# Patient Record
Sex: Male | Born: 1990 | Race: Asian | Hispanic: No | Marital: Single | Smoking: Former smoker
Health system: Southern US, Community
[De-identification: ages and names within clinical notes are randomized; demographics above are authoritative.]

---

## 2013-08-10 ENCOUNTER — Encounter (HOSPITAL_COMMUNITY): Payer: Self-pay | Admitting: Emergency Medicine

## 2013-08-10 ENCOUNTER — Emergency Department (INDEPENDENT_AMBULATORY_CARE_PROVIDER_SITE_OTHER)
Admission: EM | Admit: 2013-08-10 | Discharge: 2013-08-10 | Disposition: A | Discharge status: Home / Self Care | Payer: 59 | Source: Home / Self Care | Attending: Emergency Medicine | Admitting: Emergency Medicine

## 2013-08-10 ENCOUNTER — Emergency Department (INDEPENDENT_AMBULATORY_CARE_PROVIDER_SITE_OTHER): Payer: 59

## 2013-08-10 DIAGNOSIS — S93409A Sprain of unspecified ligament of unspecified ankle, initial encounter: Secondary | ICD-10-CM

## 2013-08-10 MED ORDER — HYDROCODONE-ACETAMINOPHEN 5-325 MG PO TABS: ORAL_TABLET | ORAL | Status: DC

## 2013-08-10 MED ORDER — IBUPROFEN 800 MG PO TABS
ORAL_TABLET | ORAL | Status: AC
  Filled 2013-08-10: qty 1

## 2013-08-10 MED ORDER — IBUPROFEN 800 MG PO TABS
800.0000 mg | ORAL_TABLET | Freq: Once | ORAL | Status: AC
  Administered 2013-08-10: 800 mg via ORAL

## 2013-08-10 NOTE — Discharge Instructions (Signed)

## 2013-08-10 NOTE — ED Provider Notes (Signed)
  Chief Complaint   Chief Complaint  Patient presents with  . Fall    History of Present Illness   Bruce Daniels is a 23 year old male who this morning slipped on some ice and twisted his right ankle. He's had pain over the lateral malleolus ever since then. It is not swollen. He did not hear a pop. He is able to bear some weight. The ankle and foot feel numb and tingly.  Review of Systems   Other than as noted above, the patient denies any of the following symptoms: Systemic:  No fevers, chills, sweats, or aches.   Musculoskeletal:  No joint pain, arthritis, bursitis, swelling, back pain, or neck pain. Neurological:  No muscular weakness or paresthesias.  PMFSH   Past medical history, family history, social history, meds, and allergies were reviewed.   Physical Examination     Vital signs:  BP 106/59  Pulse 126  Temp(Src) 98.7 F (37.1 C) (Oral)  Resp 18  SpO2 100% Gen:  Alert and oriented times 3.  In no distress. Musculoskeletal: Exam of the ankle reveals no swelling, bruising or, or deformity. There is pain to palpation just below the lateral malleolus. Anterior drawer sign was negative.  Talar tilt was negative. Squeeze test was positive. Achilles tendon, peroneal tendon, and tibialis posterior were intact. Otherwise, all joints had a full a ROM with no swelling, bruising or deformity.  No edema, pulses full. Extremities were warm and pink.  Capillary refill was brisk.  Skin:  Clear, warm and dry.  No rash. Neuro:  Alert and oriented times 3.  Muscle strength was normal.  Sensation was intact to light touch.   Radiology   Dg Ankle Complete Right  08/10/2013   CLINICAL DATA:  Pain post trauma  EXAM: RIGHT ANKLE - COMPLETE 3+ VIEW  COMPARISON:  None.  FINDINGS: Frontal, oblique, and lateral views were obtained. There is no fracture or effusion. Ankle mortise appears intact.  IMPRESSION: No fracture.  Mortise intact.   Electronically Signed   By: Bretta BangWilliam  Woodruff M.D.    On: 08/10/2013 10:39   I reviewed the images independently and personally and concur with the radiologist's findings.  Course in Urgent Care Center     He was placed in an ASO brace.  Assessment   The encounter diagnosis was Ankle sprain.  Plan     1.  Meds:  The following meds were prescribed:   New Prescriptions   HYDROCODONE-ACETAMINOPHEN (NORCO/VICODIN) 5-325 MG PER TABLET    1 to 2 tabs every 4 to 6 hours as needed for pain.    2.  Patient Education/Counseling:  The patient was given appropriate handouts, self care instructions, including rest and activity, elevation, application of ice and compression, and instructed in symptomatic relief.    3.  Follow up:  The patient was told to follow up here if no better in 3 to 4 days, or sooner if becoming worse in any way, and given some red flag symptoms such as increasing pain or neurological symptoms which would prompt immediate return.  Follow up with Dr. Roda ShuttersXu in 2 weeks if no better.     Reuben Likesavid C Holiday Mcmenamin, MD 08/10/13 [phone]

## 2013-08-10 NOTE — ED Notes (Signed)
C/o fall earlier today w injury to right knee and ankle; NAD

## 2013-08-28 ENCOUNTER — Encounter (HOSPITAL_COMMUNITY): Payer: Self-pay | Admitting: Emergency Medicine

## 2013-08-28 ENCOUNTER — Emergency Department (HOSPITAL_COMMUNITY): Payer: 59

## 2013-08-28 ENCOUNTER — Emergency Department (HOSPITAL_COMMUNITY)
Admission: EM | Admit: 2013-08-28 | Discharge: 2013-08-28 | Disposition: A | Discharge status: Home / Self Care | Payer: 59 | Attending: Emergency Medicine | Admitting: Emergency Medicine

## 2013-08-28 DIAGNOSIS — R52 Pain, unspecified: Secondary | ICD-10-CM | POA: Insufficient documentation

## 2013-08-28 DIAGNOSIS — Z23 Encounter for immunization: Secondary | ICD-10-CM | POA: Insufficient documentation

## 2013-08-28 DIAGNOSIS — S21109A Unspecified open wound of unspecified front wall of thorax without penetration into thoracic cavity, initial encounter: Secondary | ICD-10-CM | POA: Insufficient documentation

## 2013-08-28 DIAGNOSIS — F17200 Nicotine dependence, unspecified, uncomplicated: Secondary | ICD-10-CM | POA: Insufficient documentation

## 2013-08-28 DIAGNOSIS — S21119A Laceration without foreign body of unspecified front wall of thorax without penetration into thoracic cavity, initial encounter: Secondary | ICD-10-CM

## 2013-08-28 LAB — COMPREHENSIVE METABOLIC PANEL
ALT: 11 U/L (ref 0–53)
AST: 18 U/L (ref 0–37)
Albumin: 4.6 g/dL (ref 3.5–5.2)
Alkaline Phosphatase: 68 U/L (ref 39–117)
BUN: 11 mg/dL (ref 6–23)
CALCIUM: 9.9 mg/dL (ref 8.4–10.5)
CO2: 26 mEq/L (ref 19–32)
Chloride: 102 mEq/L (ref 96–112)
Creatinine, Ser: 0.75 mg/dL (ref 0.50–1.35)
GFR calc non Af Amer: >90 mL/min (ref >90)
Glucose, Bld: 92 mg/dL (ref 70–99)
Potassium: 4 mEq/L (ref 3.7–5.3)
SODIUM: 142 meq/L (ref 137–147)
TOTAL PROTEIN: 7.5 g/dL (ref 6.0–8.3)
Total Bilirubin: 0.6 mg/dL (ref 0.3–1.2)

## 2013-08-28 LAB — CBC
HCT: 39.3 % (ref 39.0–52.0)
Hemoglobin: 14.4 g/dL (ref 13.0–17.0)
MCH: 32.2 pg (ref 26.0–34.0)
MCHC: 36.6 g/dL — AB (ref 30.0–36.0)
MCV: 87.9 fL (ref 78.0–100.0)
Platelets: 214 10*3/uL (ref 150–400)
RBC: 4.47 MIL/uL (ref 4.22–5.81)
RDW: 12.5 % (ref 11.5–15.5)
WBC: 6.5 10*3/uL (ref 4.0–10.5)

## 2013-08-28 MED ORDER — TETANUS-DIPHTH-ACELL PERTUSSIS 5-2.5-18.5 LF-MCG/0.5 IM SUSP
0.5000 mL | Freq: Once | INTRAMUSCULAR | Status: AC
  Administered 2013-08-28: 0.5 mL via INTRAMUSCULAR
  Filled 2013-08-28: qty 0.5

## 2013-08-28 NOTE — ED Notes (Signed)
Bacitracin ointment applied to stapled lac, 2 staples in place, wound approximated well, no oozing or bleeding.

## 2013-08-28 NOTE — ED Notes (Signed)
[facility] refused IV, rationale for IV explained, pt again declined IV, VSS, [facility], Dr. Norlene Campbelltter in to room.
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
## 2013-08-28 NOTE — ED Notes (Signed)
1.5cm lac to R mid axillary area around 10th-11th rib area, bleeding controlled, LS CTA, cxr results reviewed, VSS,
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
## 2013-08-28 NOTE — Discharge Instructions (Signed)
Your staples will need to be removed in 7 days.  This can be done by your doctor, an urgent care, or in the ER.  Watch for signs of infection:  Redness, swelling, drainage of pus.  Return for these signs.     Staple Wound Closure Staples are used to help a wound heal faster by holding the edges of the wound together. HOME CARE  Keep the area around the staples clean and dry.  Rest and raise (elevate) the injured part above the level of your heart.  See your doctor for a follow-up check of the wound.  See your doctor to have the staples removed.  Clean the wound daily with water.  Do not soak the wound in water for long periods of time.  Let air reach the wound as it heals. GET HELP RIGHT AWAY IF:   You have redness or puffiness around the wound.  You have a red line going away from the wound.  You have more pain or tenderness.  You have yellowish-white fluid (pus) coming from the wound.  Your wound does not stay together after the staples have been taken out.  You see something coming out of the wound, such as wood or glass.  You have problems moving the injured area.  You have a fever or lasting symptoms for more than 2-3 days.  You have a fever and your symptoms suddenly get worse. MAKE SURE YOU:   Understand these instructions.  Will watch this condition.  Will get help right away if you are not doing well or get worse. Document Released: 03/18/2008 Document Revised: 03/03/2012 Document Reviewed: 12/21/2011 [facility] Patient Information 2014 [place] [place].

## 2015-03-07 ENCOUNTER — Encounter (HOSPITAL_COMMUNITY): Payer: Self-pay | Admitting: Emergency Medicine

## 2015-03-07 ENCOUNTER — Emergency Department (HOSPITAL_COMMUNITY)
Admission: EM | Admit: 2015-03-07 | Discharge: 2015-03-07 | Disposition: A | Discharge status: Home / Self Care | Payer: BLUE CROSS/BLUE SHIELD | Source: Home / Self Care | Attending: Emergency Medicine | Admitting: Emergency Medicine

## 2015-03-07 DIAGNOSIS — G43009 Migraine without aura, not intractable, without status migrainosus: Secondary | ICD-10-CM | POA: Diagnosis not present

## 2015-03-07 MED ORDER — DIPHENHYDRAMINE HCL 25 MG PO CAPS
25.0000 mg | ORAL_CAPSULE | Freq: Once | ORAL | Status: AC
  Administered 2015-03-07: 25 mg via ORAL

## 2015-03-07 MED ORDER — DEXAMETHASONE SODIUM PHOSPHATE 10 MG/ML IJ SOLN
INTRAMUSCULAR | Status: AC
  Filled 2015-03-07: qty 1

## 2015-03-07 MED ORDER — ONDANSETRON 4 MG PO TBDP
ORAL_TABLET | ORAL | Status: AC
  Filled 2015-03-07: qty 1

## 2015-03-07 MED ORDER — DEXAMETHASONE SODIUM PHOSPHATE 10 MG/ML IJ SOLN
10.0000 mg | Freq: Once | INTRAMUSCULAR | Status: AC
  Administered 2015-03-07: 10 mg via INTRAMUSCULAR

## 2015-03-07 MED ORDER — IBUPROFEN 800 MG PO TABS: 800.0000 mg | ORAL_TABLET | Freq: Three times a day (TID) | ORAL | Status: AC | PRN

## 2015-03-07 MED ORDER — KETOROLAC TROMETHAMINE 30 MG/ML IJ SOLN
INTRAMUSCULAR | Status: AC
  Filled 2015-03-07: qty 1

## 2015-03-07 MED ORDER — ONDANSETRON 4 MG PO TBDP
4.0000 mg | ORAL_TABLET | Freq: Once | ORAL | Status: AC
  Administered 2015-03-07: 4 mg via ORAL

## 2015-03-07 MED ORDER — KETOROLAC TROMETHAMINE 30 MG/ML IJ SOLN
30.0000 mg | Freq: Once | INTRAMUSCULAR | Status: AC
  Administered 2015-03-07: 30 mg via INTRAMUSCULAR

## 2015-03-07 MED ORDER — ONDANSETRON HCL 4 MG PO TABS: 4.0000 mg | ORAL_TABLET | Freq: Three times a day (TID) | ORAL | Status: DC | PRN

## 2015-03-07 MED ORDER — DIPHENHYDRAMINE HCL 25 MG PO CAPS
ORAL_CAPSULE | ORAL | Status: AC
  Filled 2015-03-07: qty 1

## 2015-03-07 NOTE — ED Notes (Signed)
The patient presented to the [facility] with a complaint of a migraine headache with nausea. The patient stated that the migraine started 4 days ago and has gotten increasingly worse with the nausea starting today. The patient stated that he has tried advil with no relief. The patient stated that he has had migraines in the past.

## 2015-03-07 NOTE — Discharge Instructions (Signed)
You are having migraine headaches. We gave you some medicines here to stop the headache. In the future, take 800 mg of ibuprofen and 4 mg of Zofran as soon as the headache starts. If you can lie down in a quiet and dark room and sleep for 30 minutes this will help. Heat exposure and dehydration as well as poor sleep can be triggers for migraines. Follow-up as needed.

## 2015-03-07 NOTE — ED Provider Notes (Signed)
CSN: [id number]     Arrival date & time 03/07/15  1717 History   First MD Initiated Contact with Patient 03/07/15 1744     Chief Complaint  Patient presents with  . Migraine  . Nausea   (Consider location/radiation/quality/duration/timing/severity/associated sxs/prior Treatment) HPI He is a 24 year old man here for evaluation of headache and nausea. He states the headache started on Sunday. It is described as a throbbing behind his eyes. He reports sensitivity to light. Over the last day he has also developed nausea and some vomiting.  He denies any focal numbness, tingling, weakness. He reports a history of similar headaches in the past. He states he gets these maybe twice a year. He states usually he can take some ibuprofen and follow sleep and the headache is gone. With this headache he has tried ibuprofen with temporary improvement. He also states the headache will be gone when he wakes up, but if he gets exposed to bright lights, it comes back.  No vision changes.  History reviewed. No pertinent past medical history. History reviewed. No pertinent past surgical history. History reviewed. No pertinent family history. Social History  Substance Use Topics  . Smoking status: Current Every Day Smoker  . Smokeless tobacco: None  . Alcohol Use: Yes    Review of Systems As in history of present illness Allergies  Review of patient's allergies indicates no known allergies.  Home Medications   Prior to Admission medications   Medication Sig Start Date End Date Taking? Authorizing Provider  ibuprofen (ADVIL,MOTRIN) 800 MG tablet Take 1 tablet (800 mg total) by mouth every 8 (eight) hours as needed for moderate pain. 03/07/15   Charm Rings, MD  ondansetron (ZOFRAN) 4 MG tablet Take 1 tablet (4 mg total) by mouth every 8 (eight) hours as needed for nausea or vomiting. 03/07/15   Charm Rings, MD   Meds Ordered and Administered this Visit   Medications  ketorolac (TORADOL) 30 MG/ML  injection 30 mg (not administered)  dexamethasone (DECADRON) injection 10 mg (not administered)  diphenhydrAMINE (BENADRYL) capsule 25 mg (not administered)  ondansetron (ZOFRAN-ODT) disintegrating tablet 4 mg (not administered)    BP 109/68 mmHg  Pulse 75  Temp(Src) 97.7 F (36.5 C) (Oral)  Resp 18  SpO2 100% No data found.   Physical Exam  Constitutional: He is oriented to person, place, and time. He appears well-developed and well-nourished. No distress.  Eyes: EOM are normal. Pupils are equal, round, and reactive to light.  Neck: Neck supple.  Cardiovascular: Normal rate, regular rhythm and normal heart sounds.   No murmur heard. Pulmonary/Chest: Effort normal and breath sounds normal. No respiratory distress. He has no wheezes. He has no rales.  Neurological: He is alert and oriented to person, place, and time. No cranial nerve deficit. He exhibits normal muscle tone. Coordination normal.    ED Course  Procedures (including critical care time)  Labs Review Labs Reviewed - No data to display  Imaging Review No results found.   MDM   1. Migraine without aura and without status migrainosus, not intractable    History is consistent with migraine headaches. Neurologic exam is normal. Acute migraine treated with Toradol, Decadron, Benadryl, and Zofran today. We'll discharge with prescription for ibuprofen 800 mg tablets and Zofran 4 mg tablets. Discussed taking these at the start of future migraines. We also discussed possible migraine triggers.    Charm Rings, MD 03/07/15 [phone]

## 2015-06-27 ENCOUNTER — Encounter (HOSPITAL_COMMUNITY): Payer: Self-pay | Admitting: Emergency Medicine

## 2015-06-27 ENCOUNTER — Emergency Department (INDEPENDENT_AMBULATORY_CARE_PROVIDER_SITE_OTHER)
Admission: EM | Admit: 2015-06-27 | Discharge: 2015-06-27 | Disposition: A | Discharge status: Home / Self Care | Payer: BLUE CROSS/BLUE SHIELD | Source: Home / Self Care

## 2015-06-27 DIAGNOSIS — K529 Noninfective gastroenteritis and colitis, unspecified: Secondary | ICD-10-CM

## 2015-06-27 MED ORDER — ONDANSETRON HCL 4 MG PO TABS: 4.0000 mg | ORAL_TABLET | Freq: Four times a day (QID) | ORAL | Status: AC

## 2015-06-27 NOTE — ED Notes (Signed)
Pt has been suffering from diarrhea, N&V, and fever since 0300 yesterday morning.  Pt has not been able to keep liquids down.

## 2015-06-27 NOTE — ED Provider Notes (Signed)
CSN: [id number]     Arrival date & time 06/27/15  1526 History   None    Chief Complaint  Patient presents with  . Diarrhea  . Emesis  . Fever   (Consider location/radiation/quality/duration/timing/severity/associated sxs/prior Treatment) [facility] and [facility]. Diarrhea is watery, without odor. Vomiting is yellow green liquid. Has had some weakness. No lighheadedness.  Was sent home from work after vomiting on the job.  History reviewed. No pertinent past medical history. History reviewed. No pertinent past surgical history. History reviewed. No pertinent family history. Social History  Substance Use Topics  . Smoking status: Former Games developermoker  . Smokeless tobacco: None  . Alcohol Use: Yes    Review of Systems ROS +'ve vomiting  Denies: HEADACHE, NAUSEA, ABDOMINAL PAIN, CHEST PAIN, CONGESTION, DYSURIA, SHORTNESS OF BREATH  Allergies  Review of patient's allergies indicates no known allergies.  Home Medications   Prior to Admission medications   Medication Sig Start Date End Date Taking? Authorizing Provider  ibuprofen (ADVIL,MOTRIN) 800 MG tablet Take 1 tablet (800 mg total) by mouth every 8 (eight) hours as needed for moderate pain. 03/07/15  Yes Charm RingsErin J Honig, MD  ondansetron (ZOFRAN) 4 MG tablet Take 1 tablet (4 mg total) by mouth every 8 (eight) hours as needed for nausea or vomiting. 03/07/15   Charm RingsErin J Honig, MD   Meds Ordered and Administered this Visit  Medications - No data to display  BP 113/76 mmHg  Pulse 89  Temp(Src) 98.7 F (37.1 C) (Oral)  Resp 16  SpO2 100% No data found.   Physical Exam NURSES NOTES AND VITAL SIGNS REVIEWED. CONSTITUTIONAL: Well developed, well nourished, no acute distress HEENT: normocephalic, atraumatic EYES: Conjunctiva normal NECK:normal ROM, supple PULMONARY:No respiratory distress, normal effort, Lungs: CTAb/l CARDIOVASCULAR: RRR, no murmur ABDOMEN: soft, ND, NT, +'ve BS MUSCULOSKELETAL: Normal ROM of all  extremities SKIN: warm and dry without rash PSYCHIATRIC: Mood and affect normal  ED Course  Procedures (including critical care time)  Labs Review Labs Reviewed - No data to display  Imaging Review No results found.   Visual Acuity Review  Right Eye Distance:   Left Eye Distance:   Bilateral Distance:    Right Eye Near:   Left Eye Near:    Bilateral Near:         MDM   1. Acute gastroenteritis    Discussed treatment options with patient including IV hydration. And zofran, patient has declined. States he does ot feel bad enough that he needs an iv stick.  Zofran rx sent to pharmacy for patient.  Return to work note provided.     Tharon AquasFrank C Jary Louvier, PA 06/27/15 1756

## 2015-06-27 NOTE — Discharge Instructions (Signed)

## 2015-11-30 IMAGING — CR DG CHEST 2V
2 series · 2 of 2 positions shown · non-contrast
Comparison: None.

CLINICAL DATA: Laceration to the right side.

EXAM:
CHEST  2 VIEW

[w chest pa *]
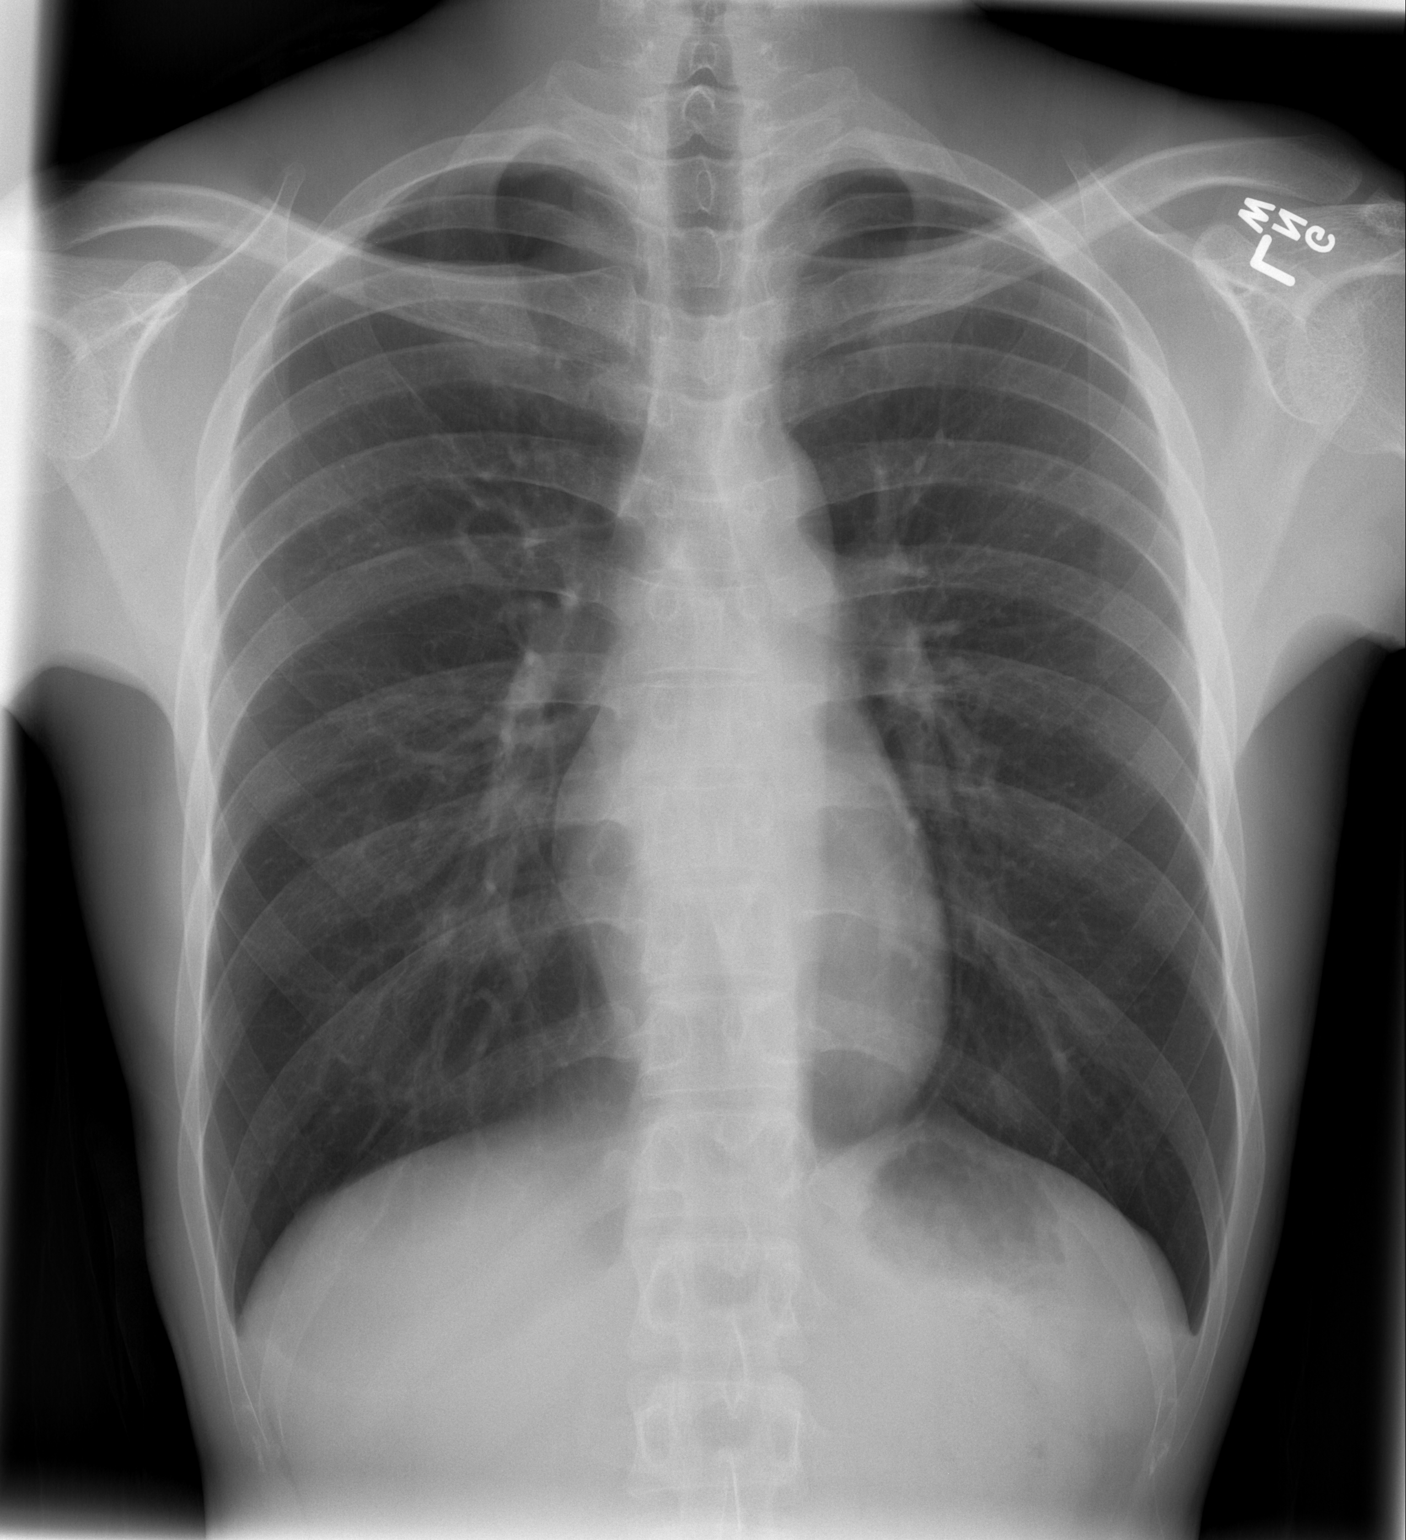

[w chest lat *]
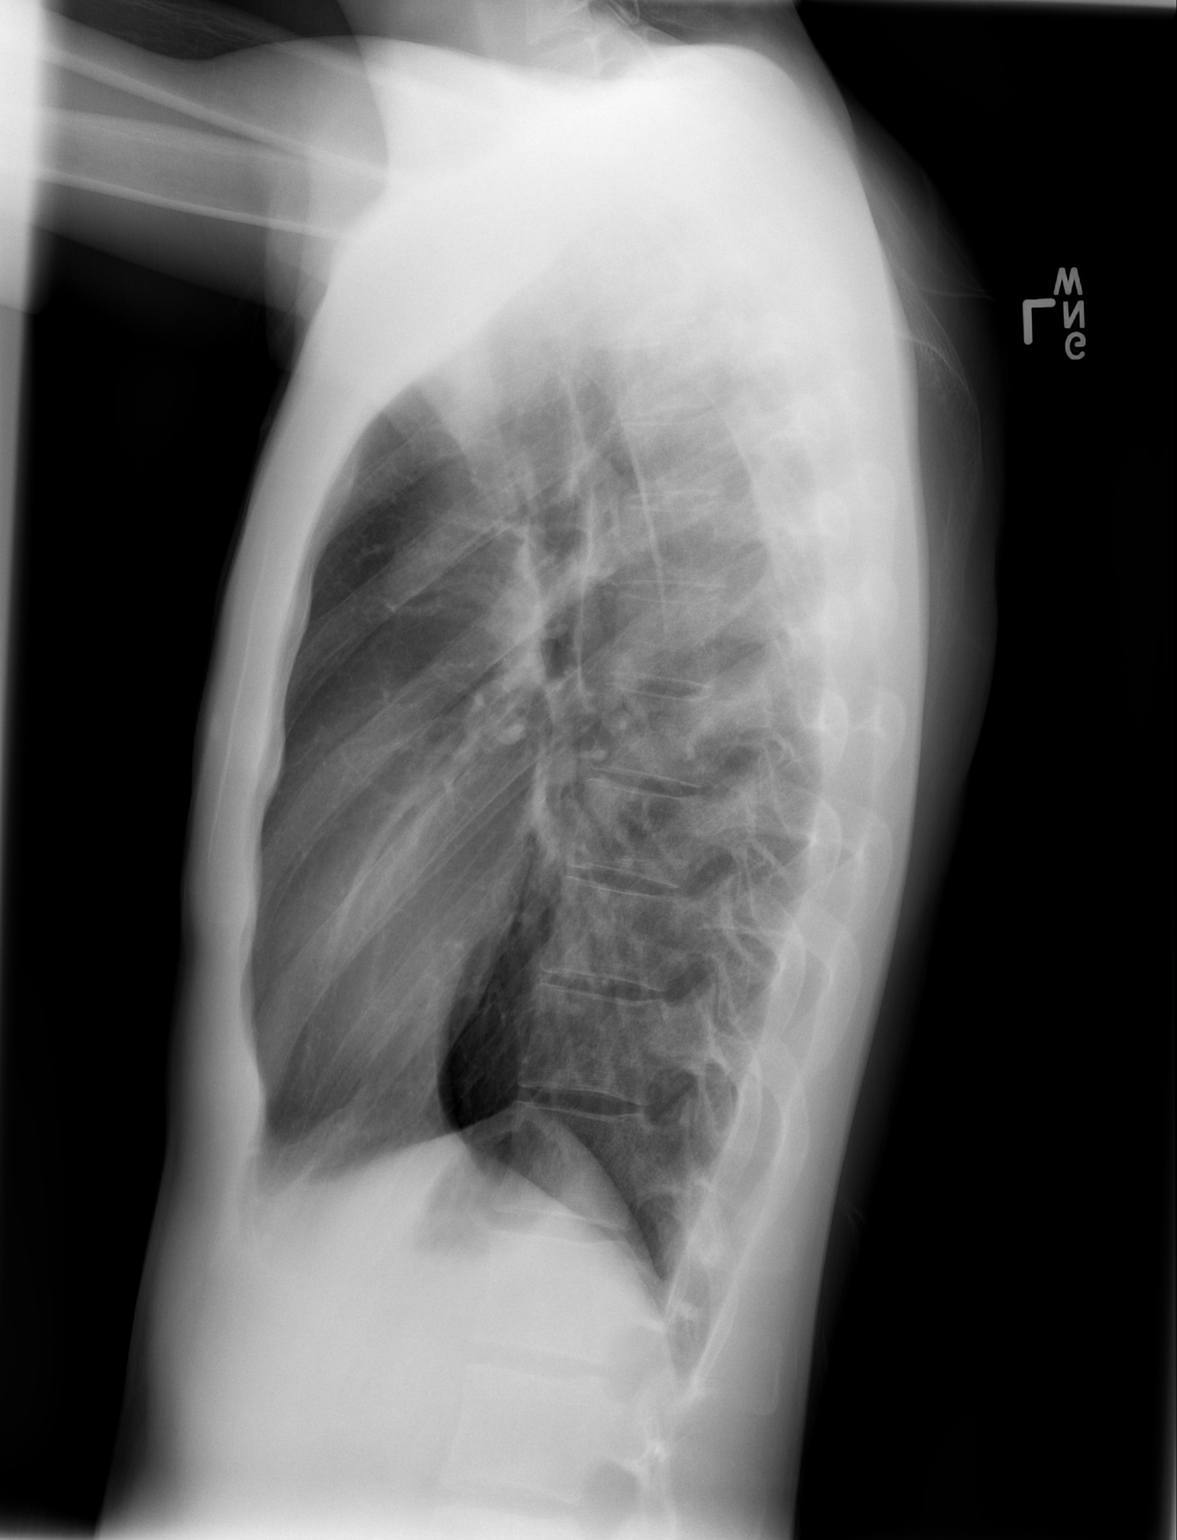

[2 of 2 positions shown; findings below may reference images not displayed]

FINDINGS: Normal heart size and mediastinal contours. No acute infiltrate or
edema. No effusion or pneumothorax. No acute osseous findings.
IMPRESSION: No active cardiopulmonary disease.

## 2016-04-22 ENCOUNTER — Other Ambulatory Visit: Payer: Self-pay

## 2016-06-12 ENCOUNTER — Ambulatory Visit (INDEPENDENT_AMBULATORY_CARE_PROVIDER_SITE_OTHER): Payer: BLUE CROSS/BLUE SHIELD | Admitting: Physician Assistant

## 2016-06-12 VITALS — BP 93/60 | HR 85 | Temp 98.1°F | Resp 16 | Ht 64.0 in | Wt 99.0 lb

## 2016-06-12 DIAGNOSIS — R112 Nausea with vomiting, unspecified: Secondary | ICD-10-CM | POA: Diagnosis not present

## 2016-06-12 DIAGNOSIS — R197 Diarrhea, unspecified: Secondary | ICD-10-CM

## 2016-06-12 DIAGNOSIS — R110 Nausea: Secondary | ICD-10-CM | POA: Diagnosis not present

## 2016-06-12 LAB — POC MICROSCOPIC URINALYSIS (UMFC): MUCUS RE: ABSENT

## 2016-06-12 LAB — POCT CBC
Granulocyte percent: 93.1 %G — AB (ref 37–80)
HCT, POC: 44.7 % (ref 43.5–53.7)
Hemoglobin: 15.8 g/dL (ref 14.1–18.1)
Lymph, poc: 0.5 — AB (ref 0.6–3.4)
MCH, POC: 32 pg — AB (ref 27–31.2)
MCHC: 35.4 g/dL (ref 31.8–35.4)
MCV: 90.4 fL (ref 80–97)
MID (cbc): 0.1 (ref 0–0.9)
MPV: 6 fL (ref 0–99.8)
PLATELET COUNT, POC: 209 10*3/uL (ref 142–424)
POC Granulocyte: 8.6 — AB (ref 2–6.9)
POC LYMPH %: 5.4 % — AB (ref 10–50)
POC MID %: 1.5 %M (ref 0–12)
RBC: 4.94 M/uL (ref 4.69–6.13)
RDW, POC: 12.6 %
WBC: 9.2 10*3/uL (ref 4.6–10.2)

## 2016-06-12 LAB — POCT URINALYSIS DIP (MANUAL ENTRY)
Glucose, UA: NEGATIVE
Leukocytes, UA: NEGATIVE
NITRITE UA: NEGATIVE
Protein Ur, POC: 100 — AB
Spec Grav, UA: >=1.03
UROBILINOGEN UA: 4
pH, UA: 6

## 2016-06-12 MED ORDER — ONDANSETRON 8 MG PO TBDP: 8.0000 mg | ORAL_TABLET | Freq: Three times a day (TID) | ORAL | 0 refills | Status: AC | PRN

## 2016-06-12 NOTE — Patient Instructions (Addendum)
Please take ibuprofen or tylenol for fever or pain.  This is likely a virus that should resolve within the next 3-5 days.  Below is a diarrhea friendly diet. Please return if you have uncontrolled fever, can not tolerate fluids, or you are having dizziness.  Food Choices to Help Relieve Diarrhea, Adult When you have diarrhea, the foods you eat and your eating habits are very important. Choosing the right foods and drinks can help relieve diarrhea. Also, because diarrhea can last up to 7 days, you need to replace lost fluids and electrolytes (such as sodium, potassium, and chloride) in order to help prevent dehydration. What general guidelines do I need to follow?  Slowly drink 1 cup (8 oz) of fluid for each episode of diarrhea. If you are getting enough fluid, your urine will be clear or pale yellow.  Eat starchy foods. Some good choices include white rice, white toast, pasta, low-fiber cereal, baked potatoes (without the skin), saltine crackers, and bagels.  Avoid large servings of any cooked vegetables.  Limit fruit to two servings per day. A serving is  cup or 1 small piece.  Choose foods with less than 2 g of fiber per serving.  Limit fats to less than 8 tsp (38 g) per day.  Avoid fried foods.  Eat foods that have probiotics in them. Probiotics can be found in certain dairy products.  Avoid foods and beverages that may increase the speed at which food moves through the stomach and intestines (gastrointestinal tract). Things to avoid include:  High-fiber foods, such as dried fruit, raw fruits and vegetables, nuts, seeds, and whole grain foods.  Spicy foods and high-fat foods.  Foods and beverages sweetened with high-fructose corn syrup, honey, or sugar alcohols such as xylitol, sorbitol, and mannitol. What foods are recommended? Grains  White rice. White, JamaicaFrench, or pita breads (fresh or toasted), including plain rolls, buns, or bagels. White pasta. Saltine, soda, or graham  crackers. Pretzels. Low-fiber cereal. Cooked cereals made with water (such as cornmeal, farina, or cream cereals). Plain muffins. Matzo. Melba toast. Zwieback. Vegetables  Potatoes (without the skin). Strained tomato and vegetable juices. Most well-cooked and canned vegetables without seeds. Tender lettuce. Fruits  Cooked or canned applesauce, apricots, cherries, fruit cocktail, grapefruit, peaches, pears, or plums. Fresh bananas, apples without skin, cherries, grapes, cantaloupe, grapefruit, peaches, oranges, or plums. Meat and Other Protein Products  Baked or boiled chicken. Eggs. Tofu. Fish. Seafood. Smooth peanut butter. Ground or well-cooked tender beef, ham, veal, lamb, pork, or poultry. Dairy  Plain yogurt, kefir, and unsweetened liquid yogurt. Lactose-free milk, buttermilk, or soy milk. Plain hard cheese. Beverages  Sport drinks. Clear broths. Diluted fruit juices (except prune). Regular, caffeine-free sodas such as ginger ale. Water. Decaffeinated teas. Oral rehydration solutions. Sugar-free beverages not sweetened with sugar alcohols. Other  Bouillon, broth, or soups made from recommended foods. The items listed above may not be a complete list of recommended foods or beverages. Contact your dietitian for more options.  What foods are not recommended? Grains  Whole grain, whole wheat, bran, or rye breads, rolls, pastas, crackers, and cereals. Wild or brown rice. Cereals that contain more than 2 g of fiber per serving. Corn tortillas or taco shells. Cooked or dry oatmeal. Granola. Popcorn. Vegetables  Raw vegetables. Cabbage, broccoli, Brussels sprouts, artichokes, baked beans, beet greens, corn, kale, legumes, peas, sweet potatoes, and yams. Potato skins. Cooked spinach and cabbage. Fruits  Dried fruit, including raisins and dates. Raw fruits. Stewed or dried prunes. Fresh  apples with skin, apricots, mangoes, pears, raspberries, and strawberries. Meat and Other Protein Products   Chunky peanut butter. Nuts and seeds. Beans and lentils. Tomasa BlaseBacon. Dairy  High-fat cheeses. Milk, chocolate milk, and beverages made with milk, such as milk shakes. Cream. Ice cream. Sweets and Desserts  Sweet rolls, doughnuts, and sweet breads. Pancakes and waffles. Fats and Oils  Butter. Cream sauces. Margarine. Salad oils. Plain salad dressings. Olives. Avocados. Beverages  Caffeinated beverages (such as coffee, tea, soda, or energy drinks). Alcoholic beverages. Fruit juices with pulp. Prune juice. Soft drinks sweetened with high-fructose corn syrup or sugar alcohols. Other  Coconut. Hot sauce. Chili powder. Mayonnaise. Gravy. Cream-based or milk-based soups. The items listed above may not be a complete list of foods and beverages to avoid. Contact your dietitian for more information.  What should I do if I become dehydrated? Diarrhea can sometimes lead to dehydration. Signs of dehydration include dark urine and dry mouth and skin. If you think you are dehydrated, you should rehydrate with an oral rehydration solution. These solutions can be purchased at pharmacies, retail stores, or online. Drink -1 cup (120-240 mL) of oral rehydration solution each time you have an episode of diarrhea. If drinking this amount makes your diarrhea worse, try drinking smaller amounts more often. For example, drink 1-3 tsp (5-15 mL) every 5-10 minutes. A general rule for staying hydrated is to drink 1-2 L of fluid per day. Talk to your health care provider about the specific amount you should be drinking each day. Drink enough fluids to keep your urine clear or pale yellow. This information is not intended to replace advice given to you by your health care provider. Make sure you discuss any questions you have with your health care provider. Document Released: 08/30/2003 Document Revised: 11/15/2015 Document Reviewed: 05/02/2013 Elsevier Interactive Patient Education  2017 [employer]     IF you received  an x-ray today, you will receive an invoice from [facility]. Please contact [facility] at [phone] with questions or concerns regarding your invoice.   IF you received labwork today, you will receive an invoice from [facility]. Please contact [facility] at [phone] with questions or concerns regarding your invoice.   Our billing staff will not be able to assist you with questions regarding bills from these companies.  You will be contacted with the lab results as soon as they are available. The fastest way to get your results is to activate your My Chart account. Instructions are located on the last page of this paperwork. If you have not heard from us regarding the results in 2 weeks, please contact this office.

## 2016-06-12 NOTE — Progress Notes (Signed)
[facility] [address], [place] [id number] [phone]  Date:  06/12/2016   Name:  Bruce Daniels   DOB:  08-13-1990   MRN:  [id number]  PCP:  No PCP Per Patient    History of Present Illness:  Bruce Daniels is a 25 y.o. male patient who [facility] cc of headache, weakness, vomiting and diarrhea that started this morning at 5am. Woke with vomiting and diarrhea.  Minimal abdominal pain. No upper respiratory symptoms. No dizziness.     Results for orders placed or performed in visit on 06/12/16  POCT CBC  Result Value Ref Range   WBC 9.2 4.6 - 10.2 K/uL   Lymph, poc 0.5 (A) 0.6 - 3.4   POC LYMPH PERCENT 5.4 (A) 10 - 50 %L   MID (cbc) 0.1 0 - 0.9   POC MID % 1.5 0 - 12 %M   POC Granulocyte 8.6 (A) 2 - 6.9   Granulocyte percent 93.1 (A) 37 - 80 %G   RBC 4.94 4.69 - 6.13 M/uL   Hemoglobin 15.8 14.1 - 18.1 g/dL   HCT, POC 57.844.7 46.943.5 - 53.7 %   MCV 90.4 80 - 97 fL   MCH, POC 32.0 (A) 27 - 31.2 pg   MCHC 35.4 31.8 - 35.4 g/dL   RDW, POC 62.912.6 %   Platelet Count, POC 209 142 - 424 K/uL   MPV 6.0 0 - 99.8 fL  POCT urinalysis dipstick  Result Value Ref Range   Color, UA yellow yellow   Clarity, UA clear clear   Glucose, UA negative negative   Bilirubin, UA small (A) negative   Ketones, POC UA small (15) (A) negative   Spec Grav, UA >=1.030    Blood, UA moderate (A) negative   pH, UA 6.0    Protein Ur, POC =100 (A) negative   Urobilinogen, UA 4.0    Nitrite, UA Negative Negative   Leukocytes, UA Negative Negative  POCT Microscopic Urinalysis ([facility])  Result Value Ref Range   WBC,UR,HPF,POC Few (A) None WBC/hpf   RBC,UR,HPF,POC Moderate (A) None RBC/hpf   Bacteria None None, Too numerous to count   Mucus Absent Absent   Epithelial Cells, UR Per Microscopy Few (A) None, Too numerous to count cells/hpf    There are no active problems to display for this patient.   History reviewed. No pertinent past medical  history.  History reviewed. No pertinent surgical history.  Social History  Substance Use Topics  . Smoking status: Former Games developermoker  . Smokeless tobacco: Never Used  . Alcohol use Yes    History reviewed. No pertinent family history.  No Known Allergies  Medication list has been reviewed and updated.  Current Outpatient Prescriptions on File Prior to Visit  Medication Sig Dispense Refill  . ibuprofen (ADVIL,MOTRIN) 800 MG tablet Take 1 tablet (800 mg total) by mouth every 8 (eight) hours as needed for moderate pain. (Patient not taking: Reported on 06/12/2016) 30 tablet 0  . ondansetron (ZOFRAN) 4 MG tablet Take 1 tablet (4 mg total) by mouth every 6 (six) hours. (Patient not taking: Reported on 06/12/2016) 12 tablet 0   No current facility-administered medications on file prior to visit.     ROS ROS otherwise unremarkable unless listed above.  Physical Examination: BP 93/60 (BP Location: Right Arm, Patient Position: Sitting, Cuff Size: Small)   Pulse 85   Temp 98.1 F (36.7 C) (Oral)   Resp 16   Ht  5\' 4"  (1.626 m)   Wt 99 lb (44.9 kg)   SpO2 96%   BMI 16.99 kg/m  Ideal Body Weight: Weight in (lb) to have BMI = 25: 145.3 Wt Readings from Last 3 Encounters:  06/12/16 99 lb (44.9 kg)  08/28/13 106 lb (48.1 kg)    Physical Exam  Constitutional: He is oriented to person, place, and time. He appears well-developed and well-nourished. No distress.  HENT:  Head: Normocephalic and atraumatic.  Eyes: Conjunctivae and EOM are normal. Pupils are equal, round, and reactive to light.  Cardiovascular: Normal rate.   Pulmonary/Chest: Effort normal. No respiratory distress.  Abdominal: Normal appearance. There is no hepatosplenomegaly. There is no tenderness.  Neurological: He is alert and oriented to person, place, and time.  Skin: Skin is warm and dry. He is not diaphoretic.  Psychiatric: He has a normal mood and affect. His behavior is normal.   Results for orders placed or  performed in visit on 06/12/16  POCT CBC  Result Value Ref Range   WBC 9.2 4.6 - 10.2 K/uL   Lymph, poc 0.5 (A) 0.6 - 3.4   POC LYMPH PERCENT 5.4 (A) 10 - 50 %L   MID (cbc) 0.1 0 - 0.9   POC MID % 1.5 0 - 12 %M   POC Granulocyte 8.6 (A) 2 - 6.9   Granulocyte percent 93.1 (A) 37 - 80 %G   RBC 4.94 4.69 - 6.13 M/uL   Hemoglobin 15.8 14.1 - 18.1 g/dL   HCT, POC 09.844.7 11.943.5 - 53.7 %   MCV 90.4 80 - 97 fL   MCH, POC 32.0 (A) 27 - 31.2 pg   MCHC 35.4 31.8 - 35.4 g/dL   RDW, POC 14.712.6 %   Platelet Count, POC 209 142 - 424 K/uL   MPV 6.0 0 - 99.8 fL  POCT urinalysis dipstick  Result Value Ref Range   Color, UA yellow yellow   Clarity, UA clear clear   Glucose, UA negative negative   Bilirubin, UA small (A) negative   Ketones, POC UA small (15) (A) negative   Spec Grav, UA >=1.030    Blood, UA moderate (A) negative   pH, UA 6.0    Protein Ur, POC =100 (A) negative   Urobilinogen, UA 4.0    Nitrite, UA Negative Negative   Leukocytes, UA Negative Negative  POCT Microscopic Urinalysis ([facility])  Result Value Ref Range   WBC,UR,HPF,POC Few (A) None WBC/hpf   RBC,UR,HPF,POC Moderate (A) None RBC/hpf   Bacteria None None, Too numerous to count   Mucus Absent Absent   Epithelial Cells, UR Per Microscopy Few (A) None, Too numerous to count cells/hpf      Assessment and Plan: Bruce Daniels is a 25 y.o. male who is here today for cc of diarrhea, n/v/ Nausea without vomiting  Nausea and vomiting, intractability of vomiting not specified, unspecified vomiting type - Plan: POCT CBC, POCT urinalysis dipstick, POCT Microscopic Urinalysis ([facility])  Diarrhea, unspecified type - Plan: POCT CBC, POCT urinalysis dipstick, POCT Microscopic Urinalysis ([facility])  Trena PlattStephanie English, PA-C [facility] 06/12/2016 3:18 PM

## 2023-05-28 ENCOUNTER — Emergency Department (HOSPITAL_COMMUNITY)
Admission: EM | Admit: 2023-05-28 | Discharge: 2023-05-28 | Disposition: A | Discharge status: Home / Self Care | Payer: BC Managed Care – PPO | Attending: Emergency Medicine | Admitting: Emergency Medicine

## 2023-05-28 ENCOUNTER — Other Ambulatory Visit: Payer: Self-pay

## 2023-05-28 DIAGNOSIS — B029 Zoster without complications: Secondary | ICD-10-CM | POA: Insufficient documentation

## 2023-05-28 DIAGNOSIS — R21 Rash and other nonspecific skin eruption: Secondary | ICD-10-CM | POA: Diagnosis present

## 2023-05-28 MED ORDER — LIDOCAINE 5 % EX PTCH: 1.0000 | MEDICATED_PATCH | CUTANEOUS | 0 refills | Status: AC

## 2023-05-28 MED ORDER — ACYCLOVIR 400 MG PO TABS: 400.0000 mg | ORAL_TABLET | Freq: Four times a day (QID) | ORAL | 0 refills | Status: AC

## 2023-05-28 MED ORDER — OXYCODONE HCL 5 MG PO TABS: 5.0000 mg | ORAL_TABLET | ORAL | 0 refills | Status: AC | PRN

## 2023-05-28 NOTE — ED Provider Notes (Signed)
[place] EMERGENCY DEPARTMENT AT [facility] Provider Note   CSN: [id number] Arrival date & time: 05/28/23  6213     History  Chief Complaint  Patient presents with   Rash    Bruce Daniels is a 32 y.o. male.  32 year old male previously healthy presents emergency department with right-sided flank pain and rash.  For 7 days has been having flank pain on his right side.  5 days ago started noticing a rash.  It is red and painful.  Hurts to move and when things touch it lightly.  No fevers or chills.  Mild headache.  No neck stiffness.  Says it is hurting too much to go to work so he decided to come in.  Did have chickenpox as a child.  No other medical conditions.       Home Medications Prior to Admission medications   Medication Sig Start Date End Date Taking? Authorizing Provider  acyclovir (ZOVIRAX) 400 MG tablet Take 1 tablet (400 mg total) by mouth 4 (four) times daily for 7 days. 05/28/23 06/04/23 Yes Rondel Baton, MD  lidocaine (LIDODERM) 5 % Place 1 patch onto the skin daily. Remove & Discard patch within 12 hours or as directed by MD 05/28/23  Yes Rondel Baton, MD  oxyCODONE (ROXICODONE) 5 MG immediate release tablet Take 1 tablet (5 mg total) by mouth every 4 (four) hours as needed. 05/28/23  Yes Rondel Baton, MD  ibuprofen (ADVIL,MOTRIN) 800 MG tablet Take 1 tablet (800 mg total) by mouth every 8 (eight) hours as needed for moderate pain. Patient not taking: Reported on 06/12/2016 03/07/15   Charm Rings, MD  ondansetron (ZOFRAN) 4 MG tablet Take 1 tablet (4 mg total) by mouth every 6 (six) hours. Patient not taking: Reported on 06/12/2016 06/27/15   Tharon Aquas, PA  ondansetron (ZOFRAN-ODT) 8 MG disintegrating tablet Take 1 tablet (8 mg total) by mouth every 8 (eight) hours as needed for nausea. 06/12/16   Trena Platt D, PA      Allergies    Patient has no known allergies.    Review of Systems   Review of Systems  Physical  Exam Updated Vital Signs BP 117/87   Pulse 92   Temp 98.7 F (37.1 C) (Oral)   Resp 16   Ht 5\' 5"  (1.651 m)   Wt 49.9 kg   SpO2 100%   BMI 18.30 kg/m  Physical Exam Constitutional:      Appearance: Normal appearance.  Pulmonary:     Effort: Pulmonary effort is normal.     Breath sounds: Normal breath sounds.  Musculoskeletal:     Cervical back: Normal range of motion and neck supple.  Skin:    Comments: Rash on right flank see below.  Vesicular.  Neurological:     Mental Status: He is alert.   Right flank:   ED Results / Procedures / Treatments   Labs (all labs ordered are listed, but only abnormal results are displayed) Labs Reviewed - No data to display  EKG None  Radiology No results found.  Procedures Procedures    Medications Ordered in ED Medications - No data to display  ED Course/ Medical Decision Making/ A&P                                 Medical Decision Making Risk Prescription drug management.   Bruce Daniels is a 32 y.o.  male who presents to the emergency department with a rash on his flank  Initial Ddx:  Shingles, cellulitis, disseminated zoster  MDM/Course:  Patient presents emergency department with a rash that was preceded by 2 days of pain.  Does appear to have shingles.  Is well localized to 1-2 dermatomes at this time.  Was given lidocaine patches and instructed to take Tylenol.  Was given short course of oxycodone as well.  Did discuss with him that antivirals would likely be of little benefit at this time but because his pain was persisting and rash was going recently was given a prescription of acyclovir to try.  Will him follow-up with his primary doctor for further evaluation and to figure out why he may have developed shingles since he is young and healthy.    This patient presents to the ED for concern of complaints listed in HPI, this involves an extensive number of treatment options, and is a complaint that carries with  it a high risk of complications and morbidity. Disposition including potential need for admission considered.   Dispo: DC Home. Return precautions discussed including, but not limited to, those listed in the AVS. Allowed pt time to ask questions which were answered fully prior to dc.  Additional history obtained from significant other Records reviewed Outpatient Clinic Notes I have reviewed the patients home medications and made adjustments as needed  Portions of this note were generated with Dragon dictation software. Dictation errors may occur despite best attempts at proofreading.           Final Clinical Impression(s) / ED Diagnoses Final diagnoses:  Herpes zoster without complication    Rx / DC Orders ED Discharge Orders          Ordered    lidocaine (LIDODERM) 5 %  Every 24 hours        05/28/23 0850    oxyCODONE (ROXICODONE) 5 MG immediate release tablet  Every 4 hours PRN        05/28/23 0850    acyclovir (ZOVIRAX) 400 MG tablet  4 times daily        05/28/23 0853              Rondel Baton, MD 05/28/23 2017

## 2023-05-28 NOTE — ED Triage Notes (Signed)
Pt. Stated, I've had this painful rash on the right side for a week tomorrow.

## 2023-05-28 NOTE — Discharge Instructions (Addendum)
You were seen for your rash (shingles) in the emergency department.   At home, please use over the counter lidocaine patches, tylenol, and ibuprofen. You may also take the oxycodone we have prescribed you for any breakthrough pain that may have.  Do not take this before driving or operating heavy machinery.  Do not take this medication with alcohol. Please also take the acyclovir to prevent worsening if your rash.   Check your MyChart online for the results of any tests that had not resulted by the time you left the emergency department.   Follow-up with your primary doctor in 1-2 weeks regarding your visit and why you developed a rash.  If you do not have a primary care doctor you may follow-up with [facility] which is listed in this packet.  Return immediately to the emergency department if you experience any of the following: rash that is spreading to places other than your ribs, or any other concerning symptoms.    Thank you for visiting our Emergency Department. It was a pleasure taking care of you today.
